# Patient Record
Sex: Male | Born: 1965 | Marital: Married
Health system: Southern US, Community
[De-identification: ages and names within clinical notes are randomized; demographics above are authoritative.]

---

## 2012-04-03 ENCOUNTER — Emergency Department: Payer: Self-pay | Admitting: Emergency Medicine

## 2012-05-26 ENCOUNTER — Emergency Department: Payer: Self-pay | Admitting: Emergency Medicine

## 2012-10-21 LAB — COMPREHENSIVE METABOLIC PANEL WITH GFR
Albumin: 4.3 g/dL
Alkaline Phosphatase: 109 U/L
Anion Gap: 11
BUN: 12 mg/dL
Bilirubin,Total: 1.1 mg/dL — ABNORMAL HIGH
Calcium, Total: 9 mg/dL
Chloride: 99 mmol/L
Co2: 27 mmol/L
Creatinine: 0.99 mg/dL
EGFR (African American): >60
EGFR (Non-African Amer.): >60
Glucose: 125 mg/dL — ABNORMAL HIGH
Osmolality: 275
Potassium: 3.7 mmol/L
SGOT(AST): 46 U/L — ABNORMAL HIGH
SGPT (ALT): 63 U/L
Sodium: 137 mmol/L
Total Protein: 8.6 g/dL — ABNORMAL HIGH

## 2012-10-21 LAB — URINALYSIS, COMPLETE
Bacteria: NONE SEEN
Blood: NEGATIVE
Glucose,UR: NEGATIVE mg/dL
Hyaline Cast: 2
Ketone: NEGATIVE
Leukocyte Esterase: NEGATIVE
Nitrite: NEGATIVE
Ph: 5
Protein: 100
RBC,UR: 4 /HPF
Specific Gravity: 1.036
Squamous Epithelial: 1
WBC UR: 6 /HPF

## 2012-10-21 LAB — DRUG SCREEN, URINE
Amphetamines, Ur Screen: NEGATIVE (ref ?–1000)
Benzodiazepine, Ur Scrn: NEGATIVE (ref ?–200)
Cannabinoid 50 Ng, Ur ~~LOC~~: NEGATIVE (ref ?–50)
Cocaine Metabolite,Ur ~~LOC~~: NEGATIVE (ref ?–300)
MDMA (Ecstasy)Ur Screen: NEGATIVE (ref ?–500)
Opiate, Ur Screen: NEGATIVE (ref ?–300)
Phencyclidine (PCP) Ur S: NEGATIVE (ref ?–25)
Tricyclic, Ur Screen: NEGATIVE (ref ?–1000)

## 2012-10-21 LAB — CBC
HCT: 43.4 %
HGB: 15.1 g/dL
MCH: 32.1 pg
MCHC: 34.9 g/dL
MCV: 92 fL
Platelet: 260 x10 3/mm 3
RBC: 4.72 x10 6/mm 3
RDW: 12.7 %
WBC: 23.3 x10 3/mm 3 — ABNORMAL HIGH

## 2012-10-21 LAB — CK TOTAL AND CKMB (NOT AT ARMC)
CK, Total: 110 U/L
CK-MB: 0.5 ng/mL

## 2012-10-21 LAB — TROPONIN I: Troponin-I: <0.02 ng/mL

## 2012-10-22 ENCOUNTER — Inpatient Hospital Stay: Payer: Self-pay | Admitting: Internal Medicine

## 2012-10-22 DIAGNOSIS — R55 Syncope and collapse: Secondary | ICD-10-CM

## 2012-10-22 LAB — CBC WITH DIFFERENTIAL/PLATELET
Basophil #: 0.1 10*3/uL (ref 0.0–0.1)
Eosinophil #: 0 10*3/uL (ref 0.0–0.7)
Eosinophil %: 0 %
Lymphocyte %: 4.2 %
MCV: 91 fL (ref 80–100)
Monocyte %: 3 %
Neutrophil #: 12.9 10*3/uL — ABNORMAL HIGH (ref 1.4–6.5)
RDW: 12.6 % (ref 11.5–14.5)

## 2012-10-22 LAB — BASIC METABOLIC PANEL
BUN: 10 mg/dL (ref 7–18)
Calcium, Total: 8.4 mg/dL — ABNORMAL LOW (ref 8.5–10.1)
Co2: 25 mmol/L (ref 21–32)
Creatinine: 0.83 mg/dL (ref 0.60–1.30)
EGFR (African American): >60
Sodium: 136 mmol/L (ref 136–145)

## 2012-10-22 LAB — CK TOTAL AND CKMB (NOT AT ARMC)
CK, Total: 89 U/L (ref 35–232)
CK, Total: 91 U/L (ref 35–232)
CK-MB: <0.5 ng/mL — ABNORMAL LOW (ref 0.5–3.6)

## 2012-10-23 LAB — HEMOGLOBIN A1C: Hemoglobin A1C: 5.5 % (ref 4.2–6.3)

## 2012-10-23 LAB — LIPID PANEL: HDL Cholesterol: 35 mg/dL — ABNORMAL LOW (ref 40–60)

## 2012-10-24 LAB — BASIC METABOLIC PANEL
Calcium, Total: 8.8 mg/dL (ref 8.5–10.1)
Chloride: 107 mmol/L (ref 98–107)
Co2: 25 mmol/L (ref 21–32)
EGFR (African American): >60
EGFR (Non-African Amer.): >60
Glucose: 119 mg/dL — ABNORMAL HIGH (ref 65–99)
Osmolality: 280 (ref 275–301)
Sodium: 141 mmol/L (ref 136–145)

## 2012-10-24 LAB — CBC WITH DIFFERENTIAL/PLATELET
Basophil %: 0.6 %
Eosinophil #: 0.3 10*3/uL (ref 0.0–0.7)
Eosinophil %: 3.8 %
HCT: 38.9 % — ABNORMAL LOW (ref 40.0–52.0)
HGB: 13.7 g/dL (ref 13.0–18.0)
Lymphocyte #: 1.2 10*3/uL (ref 1.0–3.6)
MCH: 32 pg (ref 26.0–34.0)
MCHC: 35.2 g/dL (ref 32.0–36.0)
MCV: 91 fL (ref 80–100)
Monocyte #: 0.6 x10 3/mm (ref 0.2–1.0)
Monocyte %: 7.3 %
Neutrophil #: 5.8 10*3/uL (ref 1.4–6.5)
Neutrophil %: 72.8 %
RBC: 4.28 10*6/uL — ABNORMAL LOW (ref 4.40–5.90)

## 2012-10-27 LAB — CULTURE, BLOOD (SINGLE)

## 2014-06-18 IMAGING — CT CT CHEST W/ CM
1 of 2 series · 14 of 32 positions shown, 19 images · IV contrast (APPLIED)
Comparison: none

REASON FOR EXAM: syncope, dizziness, dyspnea dimer
COMMENTS:

PROCEDURE:     CT  - CT CHEST (FOR PE) W  - October 22, 2012 [DATE]
RESULT:     Comparison: None
TECHNIQUE: Multiple thin section axial images were obtained from the lung
apices to the upper abdomen following 100 ml Isovue 370 intravenous
contrast, according to the PE protocol. These images were also reviewed on a
[employer] multiplanar work station.

[Series 8: abdomen · axial · 0.87mm/px · z∈[-646,-162]mm · 14 of 179 slices shown, 19 images]
[im 9/179  soft-tissue]
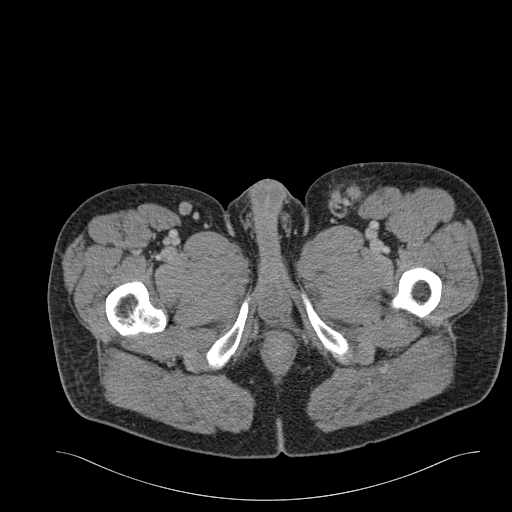
[im 9/179  bone]
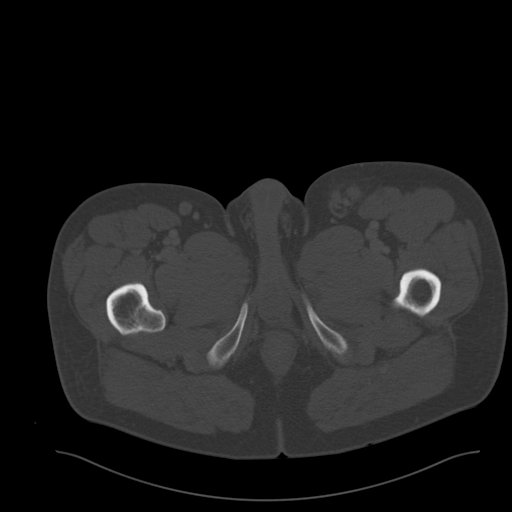
[im 26/179  soft-tissue]
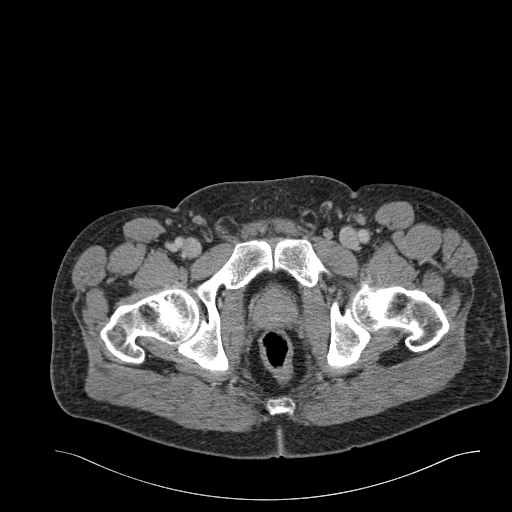
[im 34/179  soft-tissue]
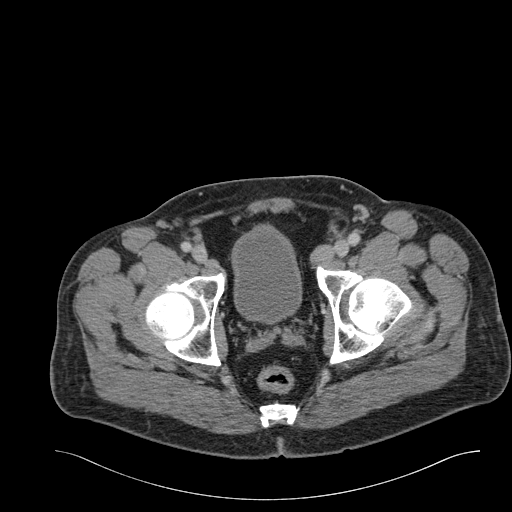
[im 51/179  soft-tissue]
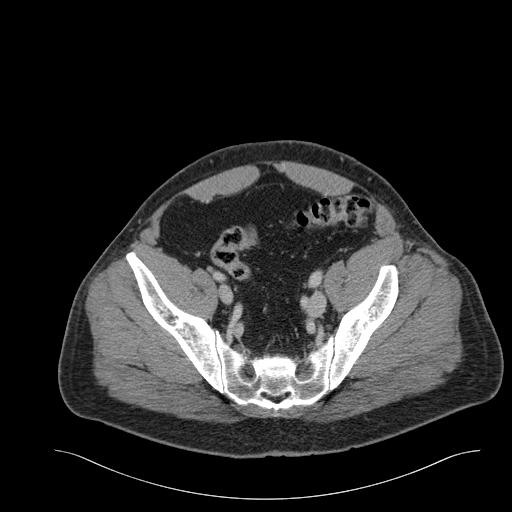
[im 60/179  soft-tissue]
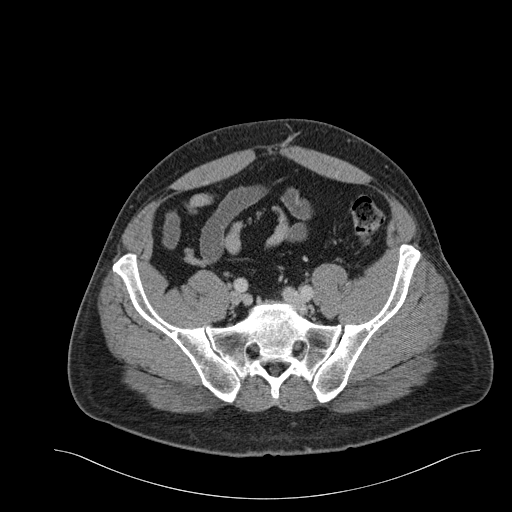
[im 77/179  soft-tissue]
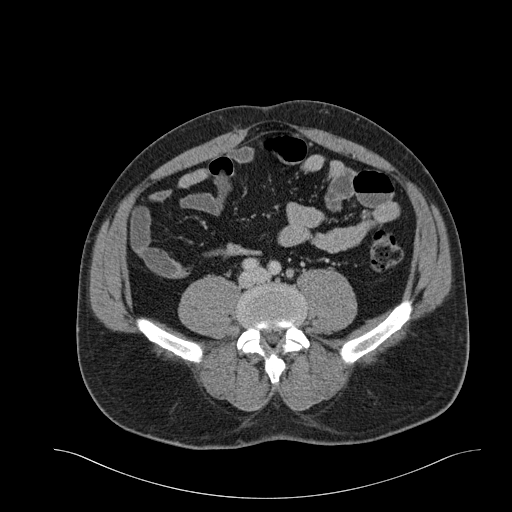
[im 94/179  soft-tissue]
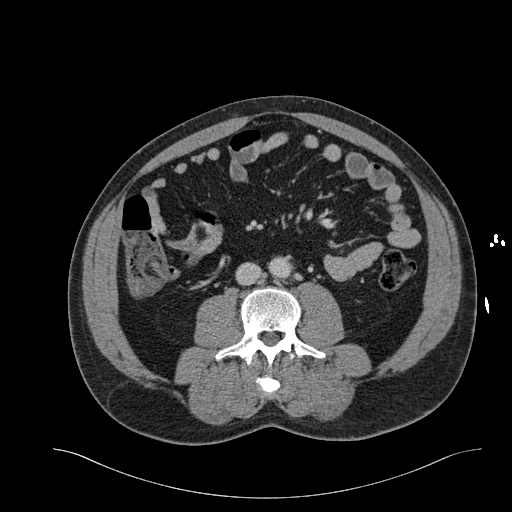
[im 102/179  soft-tissue]
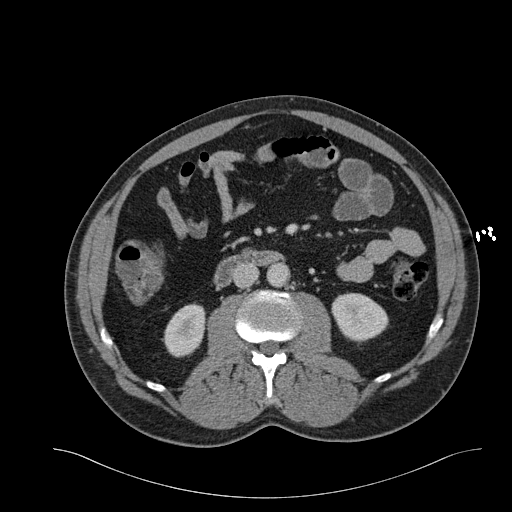
[im 119/179  soft-tissue]
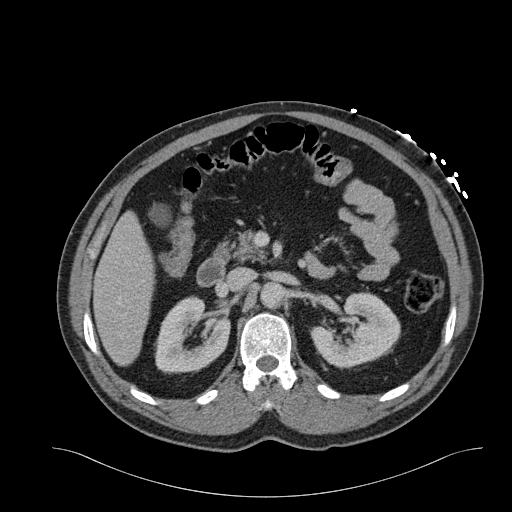
[im 119/179  bone]
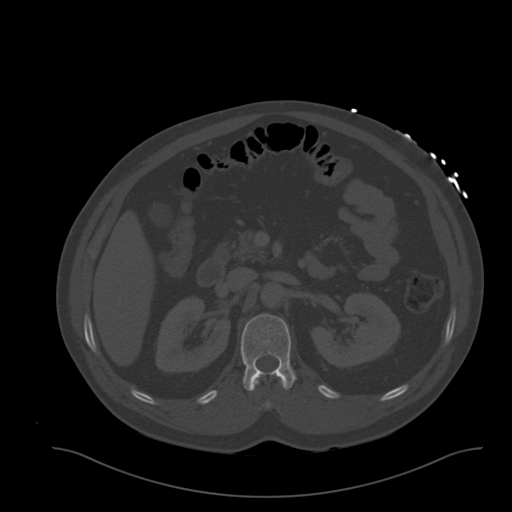
[im 128/179  soft-tissue]
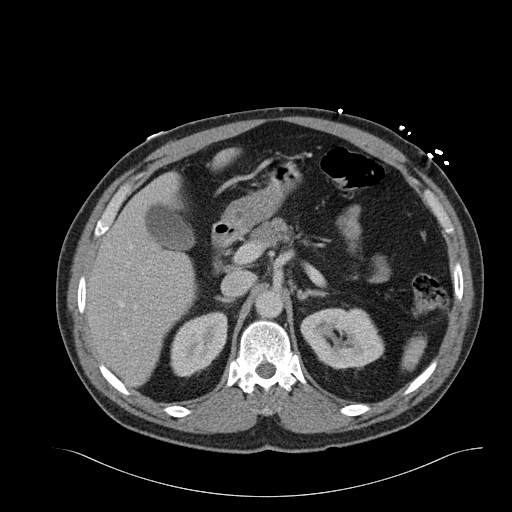
[im 145/179  soft-tissue]
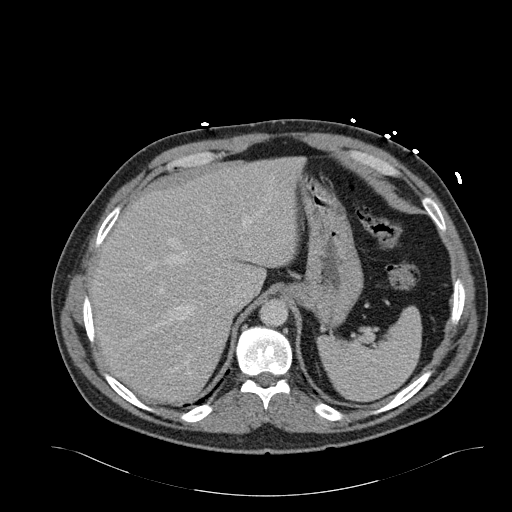
[im 145/179  lung]
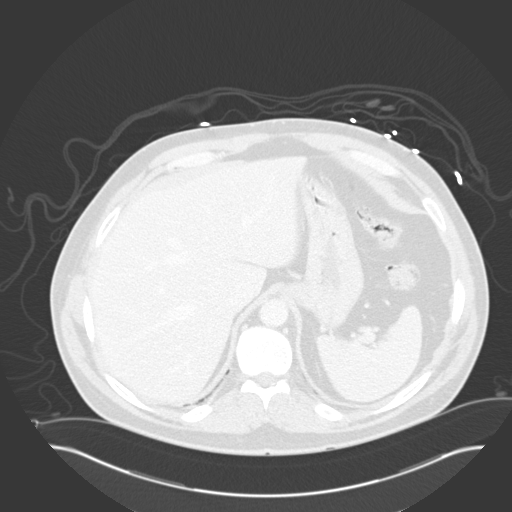
[im 153/179  soft-tissue]
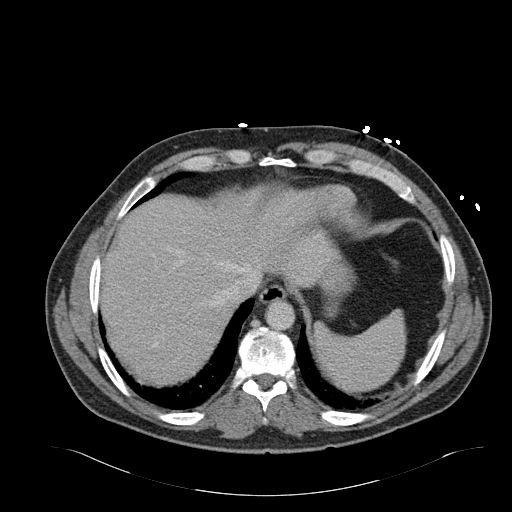
[im 153/179  lung]
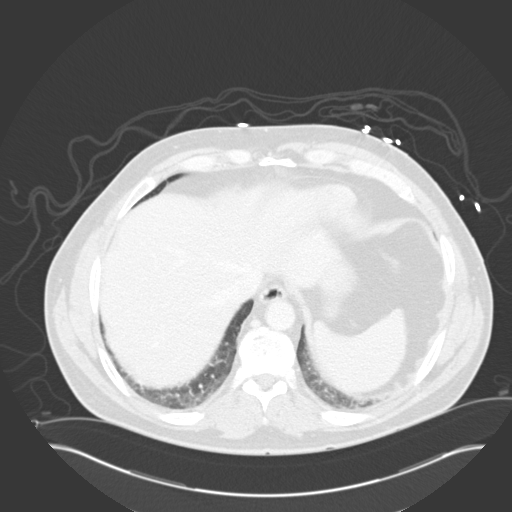
[im 162/179  lung]
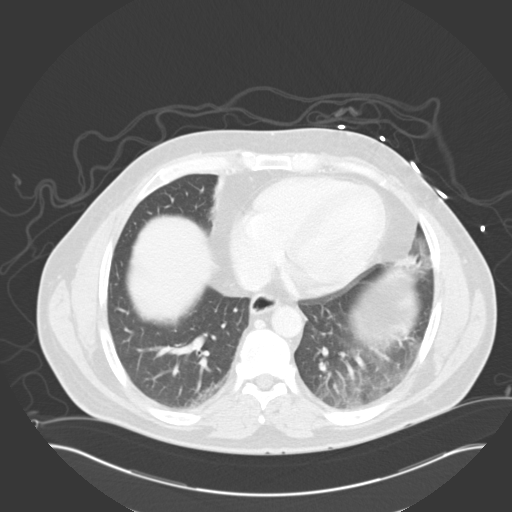
[im 170/179  soft-tissue]
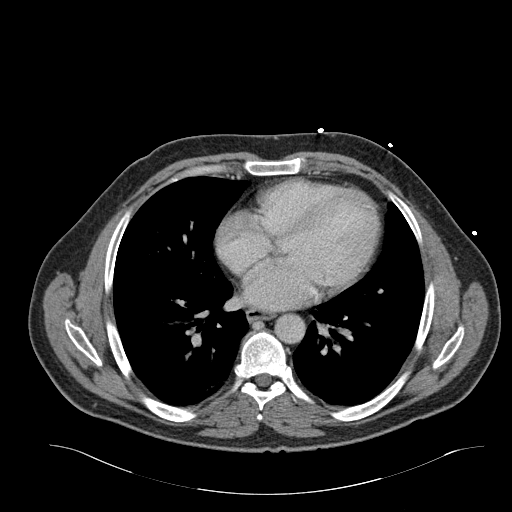
[im 170/179  lung]
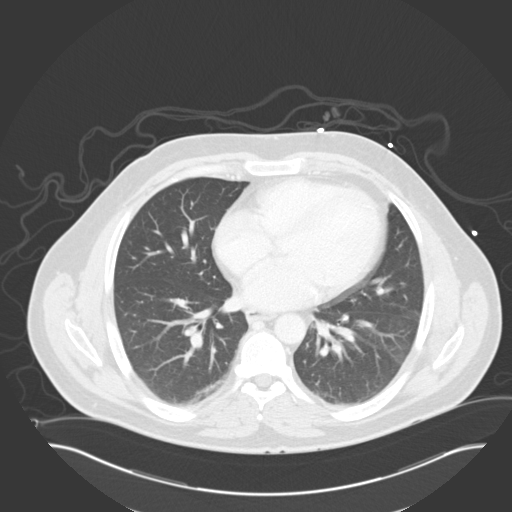

[14 of 32 positions shown; findings below may reference images not displayed]

FINDINGS: No mediastinal, hilar, or axillary lymphadenopathy.

The thoracic aorta is normal in caliber. No pulmonary embolus identified.

Mild basilar opacities are likely secondary to atelectasis. There is a 4 mm
nodule in the left upper lobe, image 41. Mild opacities in the lingula are
likely related to atelectasis.

No aggressive lytic or sclerotic osseous lesions are identified.
IMPRESSION: 1. No pulmonary embolus identified.
2. Indeterminate 4 mm pulmonary nodule. If the patient is at low risk for
lung cancer, no further follow-up is recommended.  If the patient is at high
risk for lung cancer, recommend 12 month follow-up noncontrast chest CT.

## 2014-06-19 IMAGING — US US EXTREM LOW VENOUS BILAT
1 series · 14 of 24 positions shown · non-contrast
Comparison: none

REASON FOR EXAM: elevated ddimer, eval for dvt
COMMENTS:

PROCEDURE:     US  - US DOPPLER LOW EXTR BILATERAL  - October 22, 2012  [DATE]
RESULT:     Technique: Grayscale, duplex, color flow and SPECTRAL waveform
imaging was performed to interrogate the deep venous structures of the right
and left lower extremities.

[Series 1: us extrem low venous bilat · 0.10mm/px · 14 of 61 slices shown]
[im 1/61]
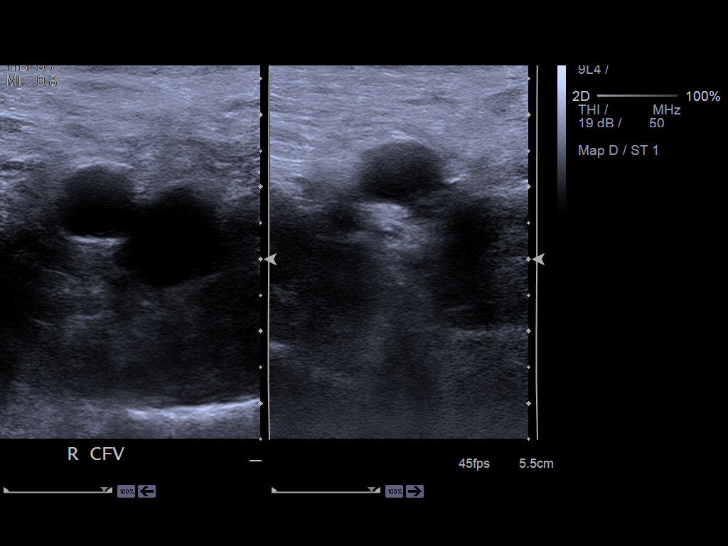
[im 6/61]
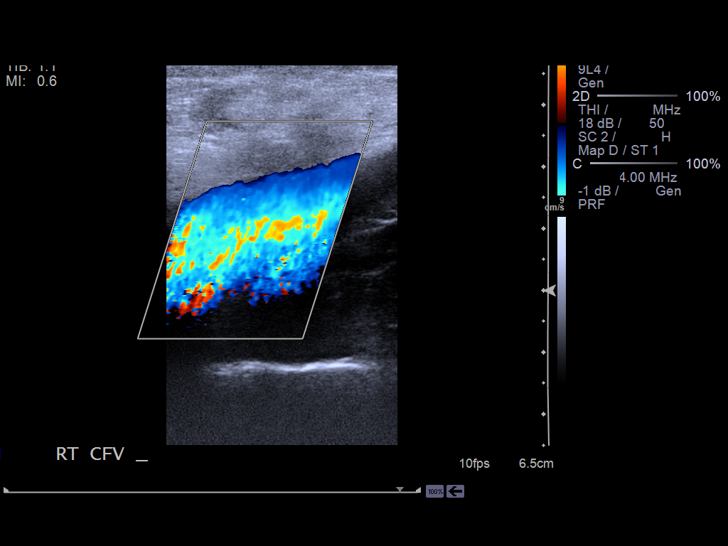
[im 11/61]
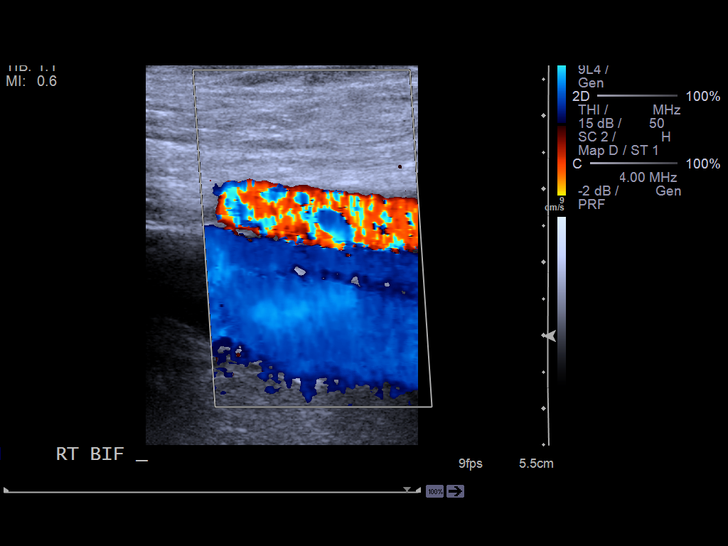
[im 16/61]
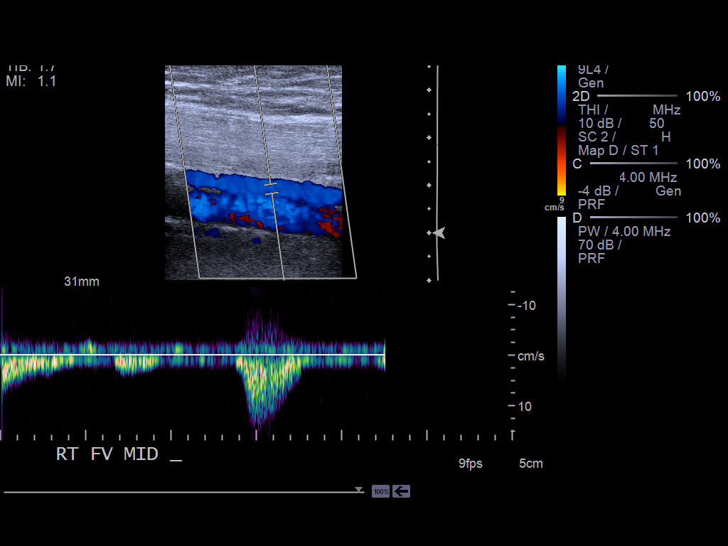
[im 19/61]
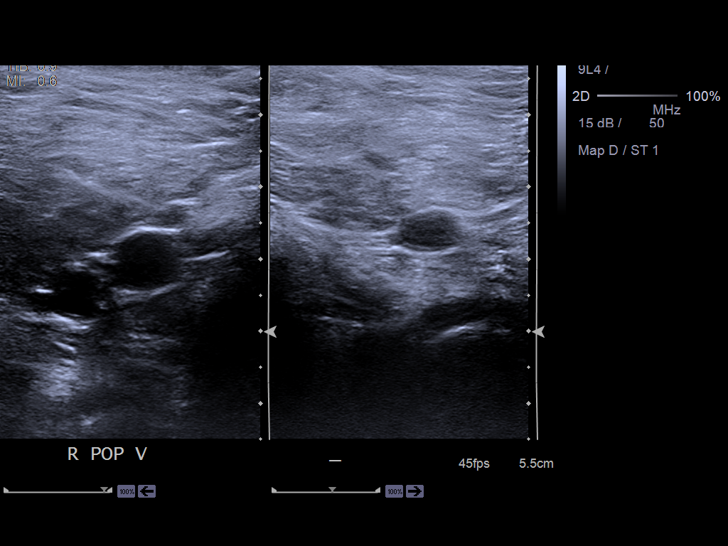
[im 24/61]
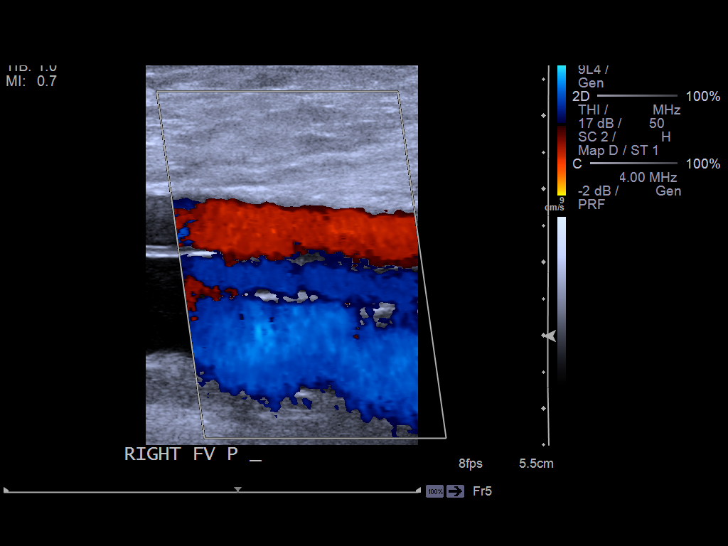
[im 29/61]
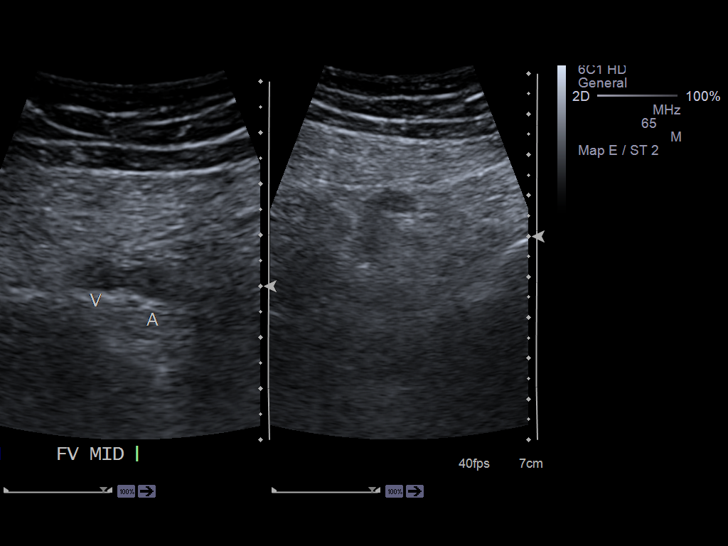
[im 32/61]
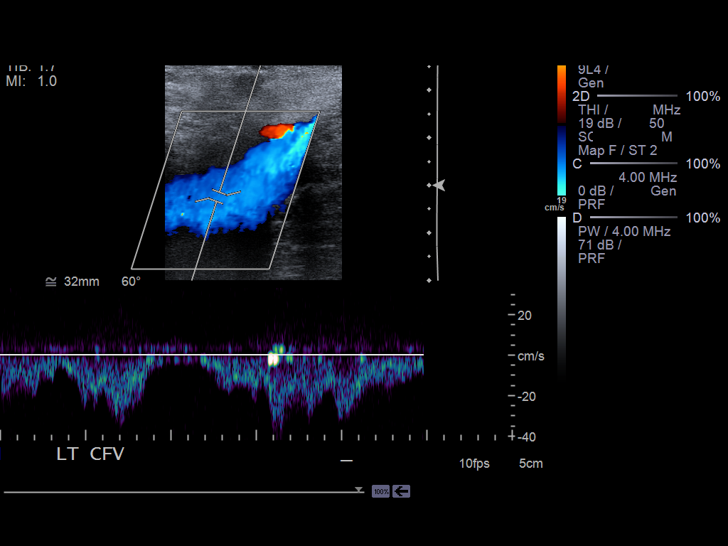
[im 37/61]
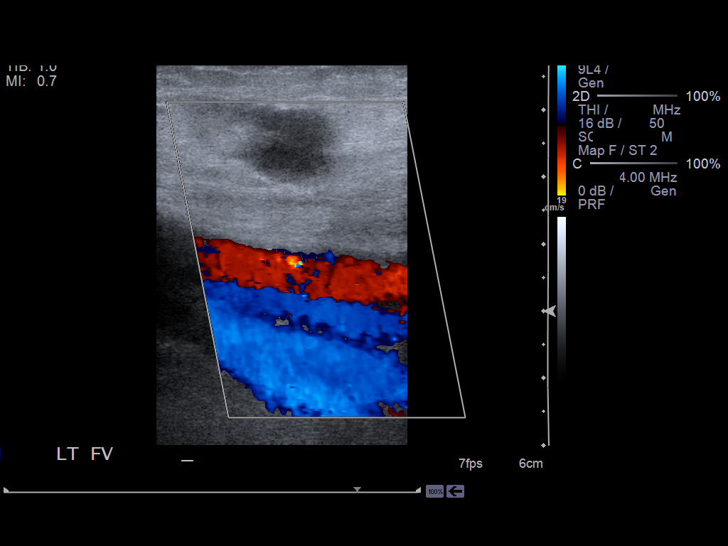
[im 42/61]
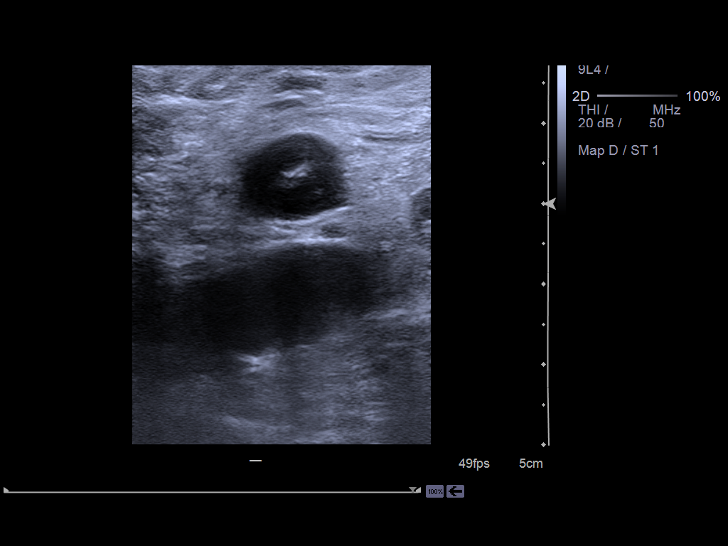
[im 47/61]
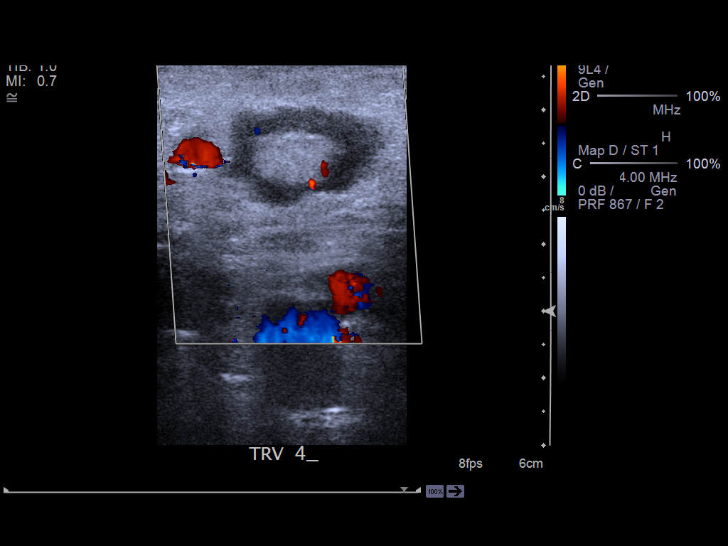
[im 50/61]
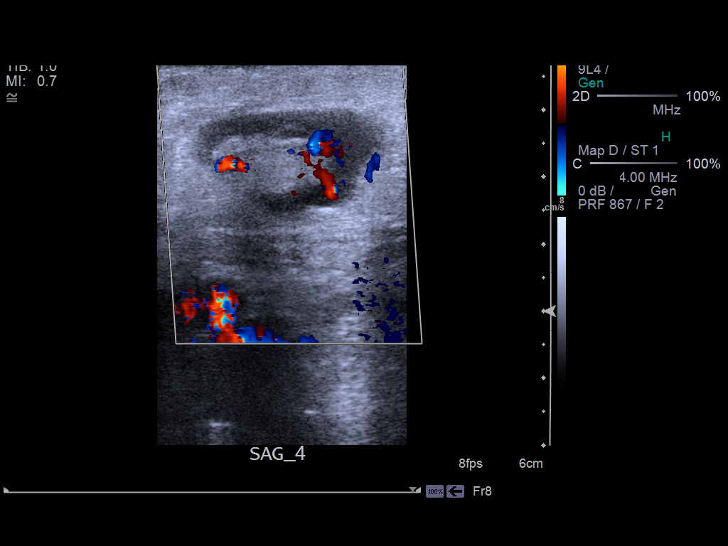
[im 55/61]
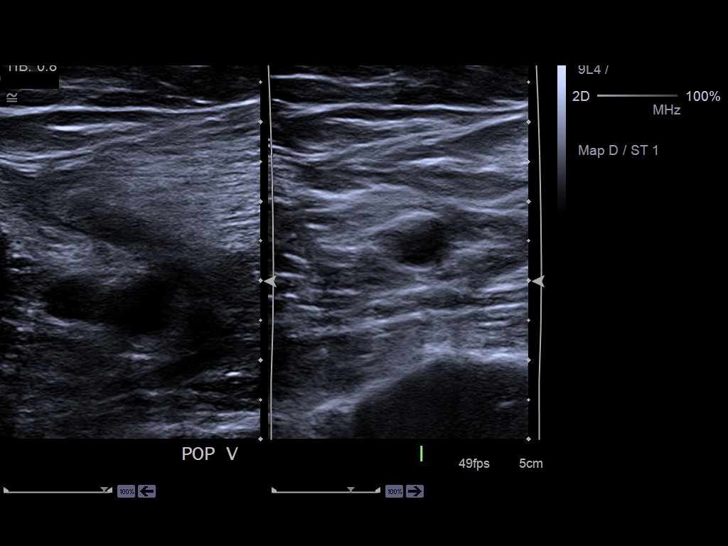
[im 61/61]
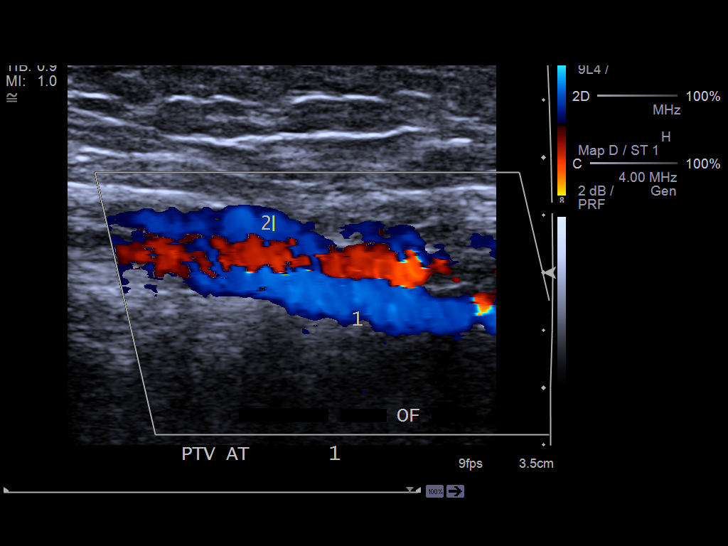

[14 of 24 positions shown; findings below may reference images not displayed]

FINDINGS: There is no evidence of increased echogenicity, non- compressibility,
abnormal waveforms nor abnormal color flow within the interrogated deep
venous structures of the right or left lower extremity. There is an
appropriate response to Valsalva and augmentation. There lymph nodes are
appreciated within the left groin region.
IMPRESSION: 1.  No sonographic evidence of a deep venous thrombus within the right or
left lower extremity.

## 2015-04-13 NOTE — Discharge Summary (Signed)
PATIENT NAME:  [name] Ochoa Ochoa, [name] Ochoa MR#:  [id number] DATE OF BIRTH:  [birth year]  DATE OF ADMISSION:  10/22/2012 DATE OF DISCHARGE:  10/24/2012  ADMITTING DIAGNOSES: Dizziness and a syncopal episode.   DISCHARGE DIAGNOSES:  1. Syncope, possibly related to systemic inflammatory response syndrome as a result of cellulitis. He was seen by cardiology, had an electroencephalogram which was negative. Telemetry showed no arrhythmia.  2. Left lower extremity cellulitis, improved with current antibiotics. His Doppler's of the lower extremity were negative for deep vein thrombosis.  3. Left inguinal lymphadenopathy, possibly from cellulitis.  4. Electrolyte imbalances status post replacement.  5. Nicotine addiction. The patient recommended to stop smoking.   PERTINENT LABORATORY AND EVALUATIONS: EKG showed sinus tachycardia, nonspecific ST-T wave changes. D-dimer was 2.04. BMP: Glucose 125, BUN 12, creatinine 0.99, sodium 137, potassium 3.7, chloride 99, CO2 27, calcium 9.0. LFTs showed a bilirubin total of 1.1, AST 46, total protein of 8.6. WBC 23.3, hemoglobin 15.1, platelet count 260. PA and lateral chest x-ray was negative.  negative. CT chest of the chest per PE protocol: No PE. Intermediate 4 mm pulmonary nodule. CT scan of the head showed no acute intracranial processes. CT of the abdomen and pelvis showed mildly enlarged left inguinal and external iliac chain lymph nodes nonspecific. No other abnormality. Influenza A and B were negative. Most recent WBC count is 7.9, hemoglobin 13.7, platelet count 235. The patient's echocardiogram showed normal ejection fraction, mild to moderate tricuspid regurgitation. Ultrasound of the bilateral lower extremities negative for any obstruction.   HOSPITAL COURSE: Please refer to history and physical done by the admitting physician. The patient is a 49 year old white male with significant history of smoking who presented to the ED with dizziness. The patient also had an episode  of syncope in the ED. He had a noted WBC count of 23,000 as well as tachycardia. The patient was thought to have possible SIRS. For his syncope, he was admitted and evaluated and had aggressive work-up including echo, carotid Doppler's, telemetry, which were all negative. He was seen by cardiology who felt that his syncope was likely due to his infection. The patient also had an electroencephalogram which was negative for any seizure activity. The patient has not had any further symptoms. In terms of his cellulitis, the patient was treated with Levaquin and vancomycin. With these treatments, his WBC count normalized and his legs started to improve significantly. The patient at this time is doing much better and is stable for discharge.   DISCHARGE MEDICATIONS:  1. Percocet 325/5 mg 1 tab p.o. every eight hours x7 more days. 2. Percocet 5/325, one tab p.o. every 8 hours p.r.n. for pain. 3. Levaquin 750, one tab p.o. every 24 hours x5 days. 4. Clindamycin 600 p.o. every six hours x5 days.   DIET: Regular.   ACTIVITY: As tolerated.   TIMEFRAME FOR FOLLOW-UP: 1 to 2 weeks in the [facility] as scheduled.   TIME SPENT: 35 minutes.  ____________________________ Lacie ScottsShreyang H. Allena KatzPatel, Ochoa shp:ap D: 10/24/2012 15:12:09 ET T: 10/25/2012 08:09:31 ET JOB#: [id number]  cc: [name] Ochoa Ochoa H. Allena KatzPatel, Ochoa, <Dictator> [facility] [name] CarwinSHREYANG H Arabel Barcenas Ochoa ELECTRONICALLY SIGNED 10/25/2012 17:36

## 2015-04-13 NOTE — H&P (Signed)
PATIENT NAME:  [name] Ochoa, [name] Ochoa MR#:  [id number] DATE OF BIRTH:  [birth year]  DATE OF ADMISSION:  10/22/2012  PRIMARY CARE PHYSICIAN: None.  CHIEF COMPLAINT: Dizziness and passing out.   HISTORY OF PRESENT ILLNESS: Mr. Aungst is a 49 year old male with no significant past medical history who is presenting to the ER with a chief complaint of dizziness. The patient was in his usual state of health until this afternoon. The patient felt dizzy yesterday morning at around 10 o'clock and came in to the ER. While he was in the ER, he passed out x2 which was witnessed by the ER staff. According to the patient's wife, the patient passed out x1 witnessed by the patient's children. They felt like it was a seizure because he was shaking. They did not notice any tongue biting or rolling of his eyes. Soon after that episode of shaking, the patient was talking fine. While he was in the ER, each episode of syncope lasted for approximately five minutes. The patient is reporting that he was feeling dizzy prior to syncopal episode and then he loses consciousness. When he was dizzy he was having diaphoresis also. Denies any chest pain but felt like his heart was racing. He had significant episode of syncope when he was 49 years old x1. Following that he never had any episodes of syncope. The patient denies any chest pain or abdominal pain. The patient was febrile in the ER with a temperature of 101 degrees Fahrenheit and white count was 23,000. During my examination, the patient is completely awake, alert, and oriented and answering all questions appropriately. CT of the head was done which showed no acute findings or bleeds. D-dimer was high so CT of the chest was done and pulmonary embolism is ruled out but the CT of the chest revealed subsegmental atelectasis and bibasilar inflammatory changes left greater than right which can be due to airway disease or aspiration. CAT scan of the abdomen and pelvis was also done which showed  appendix is borderline in size measuring up to 7 mm but no surrounding inflammation or fluid. Surgical consult was placed to the on-call surgeon regarding this finding if this is an acute appendicitis. Medical management was requested. Also, CT of the abdomen has revealed nonspecific enlargement of the left iliac chain and inguinal lymph nodes which can be secondary to inflammatory versus infectious process, however, neoplastic process was not excluded. During my examination, the patient was report dry cough for the past two days. Denies any abdominal pain, nausea, vomiting, or diarrhea. Denies any hematemesis or melena. No past history of heart problems. The patient does not have a primary care physician and is not usually seen by any doctor.  PAST MEDICAL HISTORY: Chronic low back pain.  PAST SURGICAL HISTORY: Amputation of right hand first, second, and third fingertips following an accident.   ALLERGIES: The patient has no known drug allergies.   MEDICATIONS: Not on any home medications.   SOCIAL HISTORY: Lives at home with wife and family. Smokes one pack a day, started at age 72, 30-pack-year history. Occasional intake of alcohol which he describes as maybe once in a week. Denies any illicit drug use. He works on a catering truck.   FAMILY HISTORY: Sister had history of epilepsy. Dad deceased with throat cancer. Mom is healthy.   REVIEW OF SYSTEMS: CONSTITUTIONAL: Positive fever. Denies any weakness, pain, weight loss or weight gain. EYES: Denies any blurry vision. No inflammation, glaucoma, or cataracts. ENT: Denies tinnitus, ear pain, or  hearing loss. Denies postnasal drip. Denies any difficulty swallowing. RESPIRATORY: Positive dry cough. Denies wheezing or hemoptysis. Denies dyspnea. Denies tuberculosis. CARDIOVASCULAR: Denies chest pain. Positive palpitations. Positive syncope. Denies any varicose veins. GI: Denies nausea, vomiting, diarrhea, abdominal pain, hematemesis, or melena. Denies any  irritable bowel syndrome or jaundice. GENITOURINARY: Denies dysuria or hematuria. ENDOCRINE: Denies polyuria, nocturia, or thyroid problems. Positive diaphoresis. HEMATOLOGIC/LYMPHATIC: Positive left groin swollen glands. Denies easy bruising or bleeding. INTEGUMENTARY: Denies any acne, rash, or lesions. MUSCULOSKELETAL: Chronic history of low back pain but denies shoulder pain or knee pain. NEUROLOGIC: Denies any weakness, dysarthria, epilepsy, tremor, or vertigo. PSYCHOLOGIC: Denies insomnia, depression, or anxiety.   PHYSICAL EXAMINATION:   VITAL SIGNS: Temperature 99.8 initially but eventually the patient's temperature was 101.5, blood pressure 116/79, pulse fluctuating between 104 to 114, pulse oximetry 94 to 98%.   GENERAL APPEARANCE: Not in any acute distress. Answering questions appropriately. Well built and well nourished.   HEENT: Normocephalic and atraumatic. Pupils equally reactive to light and accommodation. No conjunctival injection. No scleral icterus. Moist mucous membranes. No pharyngeal edema.   NECK: Supple. No thyromegaly. No masses. No JVD. No cervical lymphadenopathy.   LUNGS: Moderate air entry. No wheezing. Positive crackles right lower lung.   CARDIOVASCULAR: Essentially normal regular rate and rhythm. No murmurs. Point of maximal index is intact.   GI: Soft. Bowel sounds are positive in all four quadrants. Nontender, nondistended. No rebound tenderness. No masses felt.   NEUROLOGIC: Awake, alert, and oriented x3. Cranial nerves II through XII are grossly intact. Motor and sensory intact. Reflexes are 2+. Tone is normal.   EXTREMITIES: No edema. No cyanosis. No clubbing. Left lower extremity is tender to palpation but no calf muscle tenderness. Right lower extremity is intact. Very minimal erythema is noted in the left lower leg. No discharge.   LYMPHATICS: Positive left inguinal lymphadenopathy.   LABORATORY, DIAGNOSTIC, AND RADIOLOGICAL DATA: CT of the head no  evidence of acute intracranial hemorrhage, mass, or mass effect. Areas of focal inflammation of lower frontal sinus and opacification of several ethmoid air cells noted.   CT of the chest no evidence of acute pulmonary embolism. Nonspecific pulmonary findings may be due to airway disease or aspiration.  CT of the abdomen and pelvis revealed nonspecific enlargement of the left iliac chain and inguinal lymph nodes may be due to an inflammatory or infectious process, however, neoplastic process cannot be excluded. Appendix is borderline in size measuring up to 7 mm but no surrounding inflammation or fluid is seen.   Orthostatics are ordered which are pending.   Glucose 125, sodium 137, potassium 3.7, chloride 99, CO2 27, BUN 12, creatinine 0.99, anion gap 11, serum osmolality 275, calcium 9.0, total protein 8.6, albumin 4.3, total bilirubin slightly elevated at 1.1, SGOT 46, SGPT 63, alkaline phosphatase normal at 109. Accu-Chek 140. Total CK 110. CPK-MB 0.5. Troponin-I less than 0.02. Urine drug screen is negative. CBC WBC elevated at 23.3, hemoglobin 15.1, hematocrit 43.4, platelet count 260,000, MCV is in normal range at 92. D-dimer elevated at 2.04. Urinalysis amber cloudy in appearance, glucose negative, bilirubin 1+, ketones negative, leukocyte esterase negative, epithelial cells 1.   ASSESSMENT AND PLAN: This is a 49 year old male with no significant past medical history presenting to the ER with chief complaint of syncopal episodes x3 associated with dizziness and diaphoresis who is being admitted. 1. Syncope, probably from community-acquired pneumonia versus acute sinusitis. Rule out neurovascular etiology. Rule out seizure episodes. Blood cultures x2 are ordered. Sputum  culture x2 is ordered. Nasal swab for Influenza is ordered which is pending at this time. The patient has received Invanz x1 in the ER. Will continue IV Levofloxacin in view of fever and leukocytosis with possibility of right lower  lobe pneumonia. Neurology consult is placed. Cardiology consult is placed. Will get syncope workup with carotid Doppler's and 2-D echocardiogram. Neuro checks q.4 hours for the next 24 hours. Will repeat CBC and BMP in a.m. Will provide gentle hydration with IV fluids. Will follow-up with Surgery as needed basis but currently the patient is asymptomatic.  2. Left lower leg tenderness with minimal erythema. Will get an x-ray of the leg. The patient denies any fall or injury.  3. Left inguinal lymphadenopathy. Etiology is unclear. Inflammation versus infectious process. 4. Chronic history of lumbago. Will give Percocet on an as needed basis.  5. Will provide GI and DVT prophylaxis.   CODE STATUS: FULL CODE.  Diagnosis and plan of care was discussed in detail with the patient and wife at bedside. They voiced their understanding of the plan.   TOTAL TIME SPENT ON HISTORY AND PHYSICAL AND COORDINATION OF CARE: 60 minutes.   ____________________________ Ramonita LabAruna Reighlyn Elmes, MD ag:drc D: 10/22/2012 03:35:54 ET T: 10/22/2012 07:18:40 ET JOB#: [id number]  cc: Ramonita LabAruna Blayze Haen, MD, <Dictator> Ramonita LabARUNA Chardonnay Holzmann MD ELECTRONICALLY SIGNED 11/02/2012 7:01

## 2015-04-13 NOTE — Consult Note (Signed)
PATIENT NAME:  [name] Ochoa, [name] Ochoa MR#:  [id number] DATE OF BIRTH:  Oct 18, 1966  DATE OF CONSULTATION:  10/22/2012  REFERRING PHYSICIAN:  Ramonita Lab, MD CONSULTING PHYSICIAN:  Aevah Stansbery A. Kirke Corin, MD  REASON FOR CONSULTATION: Syncope.   HISTORY OF PRESENT ILLNESS: This is a 49 year old male with no significant past medical history. He presented to the Emergency Room with dizziness. The patient woke up yesterday with a headache in the morning and was not feeling well. He started feeling dizzy in the afternoon. He came to the Emergency Room. While he was in the Emergency Room last night, he was noticed to have loss of consciousness twice while he was sitting in the chair. He was shaking at that time. There was no tongue biting or incontinence. The episode lasted for about five minutes. This was proceeded with dizziness. The patient was noted to be febrile on presentation with a white cell count of 23,000. D-dimer was elevated. CT scan of the chest showed no evidence of pulmonary embolism. CT scan of the abdomen showed borderline enlargement of the appendix. The patient was started on IV fluids and antibiotics. He currently feels better. He denies any chest pain, dyspnea. He reports previous syncopal episode years ago but none recently up until the current event.   PAST MEDICAL HISTORY: Chronic low back pain.   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   ALLERGIES: No known drug allergies.   HOME MEDICATIONS: None.   SOCIAL HISTORY: The patient smokes one pack per day. He is married and lives with his wife. He drinks alcohol occasionally and denies any recreational drug use.   FAMILY HISTORY: Negative for premature coronary artery disease.   REVIEW OF SYSTEMS: A 10 point review of systems was performed. It is negative other than what is mentioned in the history of present illness.   PHYSICAL EXAMINATION:   GENERAL: The patient appears to be  at his stated age and in no acute distress.   VITAL SIGNS: Temperature 99.4, pulse 110, respiratory rate 22, blood pressure 148/99, and oxygen saturation is 95% on room air.   HEENT: Normocephalic, atraumatic.   NECK: No jugular venous distention or carotid bruits.   RESPIRATORY: Normal respiratory effort with no use of accessory muscles. Auscultation reveals normal breath sounds.   CARDIOVASCULAR: Normal PMI. Normal S1 and S2 with no gallops or murmurs.   ABDOMEN: Benign, nontender, and nondistended.   EXTREMITIES: No clubbing or cyanosis. The patient has mild swelling in the left lower extremity close to the ankle with redness and warmth to touch. The area is very tender.   LABS/DIAGNOSTICS: His labs were remarkable for leukocytosis with a white cell count of 23,000 initially which went down to 14,000. Cardiac enzymes have been negative.   ECG showed sinus tachycardia with no significant ST or T wave changes.   IMPRESSION:  1. Syncope, likely noncardiac. It could have been seizure activity.  2. Systemic inflammatory response syndrome.   RECOMMENDATIONS: The patient's loss of consciousness does not seem to be cardiac in nature. He appears to have systemic infection with fever, leukocytosis, and tachycardia. He is currently not in shock as his blood pressure seems to be in the normal range. His left lower extremity seems to be very tender to touch and warm. It appears that he has at least cellulitis, but possibly a more involved systemic infection. I reviewed his echocardiogram which showed normal LV systolic function, mild mitral and tricuspid regurgitation with normal  right ventricular     function. I do not think that the patient has any cardiac event going on. No further cardiac work-up is needed at this time. Continue investigation for the cause of his infection. He likely will need x-rays of his left lower extremity and if negative will likely need a MRI.   ____________________________ Chelsea AusMuhammad A. Kirke CorinArida, MD maa:slb D: 10/22/2012 17:12:19 ET T: 10/22/2012 17:39:22 ET JOB#: [id number]  cc: Syrina Wake A. Kirke CorinArida, MD, <Dictator> [place] OuchMUHAMMAD A Domanique Luckett MD ELECTRONICALLY SIGNED 10/26/2012 10:33

## 2015-04-13 NOTE — Consult Note (Signed)
PATIENT NAME:  [name] Ochoa, [name] Ochoa MR#:  [id number] DATE OF BIRTH:  14-Oct-1966  DATE OF CONSULTATION:  10/22/2012  REFERRING PHYSICIAN:   CONSULTING PHYSICIAN:  Bentli Llorente A. Skylen Spiering, MD  REASON FOR CONSULTATION: Leukocytosis and fevers.   HISTORY OF PRESENT ILLNESS: [name] is a pleasant 49 year old male who developed a headache this morning and later dizziness. He presented to the ED and supposedly has passed out multiple times since then. Currently he still complains of dizziness and headache as well as back pain. He says otherwise he has been doing fine up until now. He is febrile at 101.5 currently in the ED. No chills, night sweats, shortness of breath, chest pain, abdominal pain, nausea, vomiting, diarrhea, constipation, dysuria, or hematuria.   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 No abdominal surgeries.   MEDICATIONS: No current medications.   ALLERGIES: No known drug allergies.   SOCIAL HISTORY:  He is here with his wife. Smokes a pack a day times 20 years. Social alcohol user. No illegal drugs.   FAMILY HISTORY: Cancer in dad and uncle. Dad had throat cancer.   REVIEW OF SYSTEMS: Total 12-point review of systems was obtained. Pertinent positives and negatives are in the review of systems.   PHYSICAL EXAM:  VITAL SIGNS: Temperature currently 99.8,  pulse 114, blood pressure 123/61, respirations are 18, 95% on room air.   GENERAL: No acute distress, alert and oriented times three.   HEAD: Normocephalic, atraumatic.   EYES: No scleral icterus. No conjunctivitis.   FACE: No obvious trauma.   NECK: Normal. No obvious masses. Supple.   HEART: Regular rate and rhythm. No murmurs, rubs, or gallops.   CHEST: Lungs clear to auscultation. Moving air well.   ABDOMEN: Soft, mildly distended, nontender.   EXTREMITIES: Moves all extremities well. Strength five out of five in all four extremities.    LABORATORY, RADIOLOGICAL AND DIAGNOSTIC DATA: Concerning for white cell count of 23.3, hemoglobin 15.1, hematocrit 43.4, platelets 260. Urinalysis is negative for leukocyte esterase and nitrites. Renal panel is unremarkable.  Bilirubin significant for 1.1.  I have personally reviewed Mr Reichenbach's CT scan.  CT scan shows no obvious intraabdominal processes. Normal appendix. No parous and appendiceal stranding.   ASSESSMENT AND PLAN: [name] is a pleasant 49 year old male with headache, fever, and leukocytosis. I have been consulted for appendicitis. He has relatively no abdominal pain and relatively unimpressive CT scan for somebody with a white cell count of 24,000. We will continue to follow with you, but unsure if intraabdominal process etiology.     ____________________________ Si Raiderhristopher A. Unika Nazareno, MD cal:bjt D: 10/22/2012 02:11:55 ET T: 10/22/2012 09:33:35 ET JOB#: [id number]  cc: Cristal Deerhristopher A. Mahrosh Donnell, MD, <Dictator> Jarvis NewcomerHRISTOPHER A Tonni Mansour MD ELECTRONICALLY SIGNED 10/23/2012 20:00

## 2015-04-13 NOTE — Consult Note (Signed)
Brief Consult Note: Diagnosis: syncope likely non cardiac. SIRS likely due to LLE cellulitis and possible osteomylitis.   Patient was seen by consultant.   Consult note dictated.   Comments: Echo was unremarkable. He appears to have systemic infection. LLE is very tender and warm.  No futher cardiac work up.  Electronic Signatures: Lorine BearsArida, Iyah Laguna (MD)  (Signed 29-Oct-13 17:04)  Authored: Brief Consult Note   Last Updated: 29-Oct-13 17:04 by Lorine BearsArida, Fabrizzio Marcella (MD)
